# Patient Record
Sex: Male | Born: 1985 | Race: White | Hispanic: No | Marital: Single | Smoking: Current every day smoker
Health system: Southern US, Community
[De-identification: ages and names within clinical notes are randomized; demographics above are authoritative.]

## PROBLEM LIST (undated history)

## (undated) HISTORY — PX: HX WISDOM TEETH EXTRACTION: SHX21

---
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
## 2008-11-11 ENCOUNTER — Emergency Department
Admit: 2008-11-11 | Discharge: 2008-11-11 | Disposition: A | Discharge status: Home / Self Care | Payer: Self-pay | Attending: EXTERNAL | Admitting: EXTERNAL

## 2009-05-16 ENCOUNTER — Emergency Department
Admit: 2009-05-16 | Discharge: 2009-05-16 | Disposition: A | Discharge status: Home / Self Care | Payer: Self-pay | Attending: EXTERNAL | Admitting: EXTERNAL

## 2009-08-19 ENCOUNTER — Emergency Department: Admit: 2009-08-19 | Disposition: A | Discharge status: Home / Self Care | Payer: Self-pay

## 2010-04-26 ENCOUNTER — Emergency Department: Admit: 2010-04-26 | Disposition: A | Discharge status: Home / Self Care | Payer: Self-pay

## 2010-12-09 ENCOUNTER — Inpatient Hospital Stay (HOSPITAL_COMMUNITY)
Admission: EM | Admit: 2010-12-09 | Discharge: 2010-12-13 | DRG: 603 | Disposition: A | Discharge status: Home / Self Care | Payer: 59 | Attending: Infectious Diseases | Admitting: Infectious Diseases

## 2010-12-09 DIAGNOSIS — L0390 Cellulitis, unspecified: Secondary | ICD-10-CM

## 2010-12-09 DIAGNOSIS — F17200 Nicotine dependence, unspecified, uncomplicated: Secondary | ICD-10-CM | POA: Diagnosis present

## 2010-12-09 DIAGNOSIS — L02429 Furuncle of limb, unspecified: Principal | ICD-10-CM | POA: Diagnosis present

## 2010-12-09 DIAGNOSIS — IMO0002 Reserved for concepts with insufficient information to code with codable children: Secondary | ICD-10-CM | POA: Diagnosis present

## 2010-12-09 LAB — BASIC METABOLIC PANEL
BUN: 7 mg/dL (ref 6–23)
CO2: 26 mEq/L (ref 19–32)
Chloride: 96 mEq/L (ref 96–112)
Creatinine, Ser: 0.96 mg/dL (ref 0.50–1.35)
Glucose, Bld: 96 mg/dL (ref 70–99)

## 2010-12-09 LAB — CBC
HCT: 43.2 % (ref 39.0–52.0)
MCV: 90.2 fL (ref 78.0–100.0)
RBC: 4.79 MIL/uL (ref 4.22–5.81)
WBC: 15.2 10*3/uL — ABNORMAL HIGH (ref 4.0–10.5)

## 2010-12-09 LAB — DIFFERENTIAL
Eosinophils Relative: 0 % (ref 0–5)
Lymphocytes Relative: 14 % (ref 12–46)
Lymphs Abs: 2.1 10*3/uL (ref 0.7–4.0)
Neutrophils Relative %: 76 % (ref 43–77)

## 2010-12-10 ENCOUNTER — Encounter: Payer: Self-pay | Admitting: Internal Medicine

## 2010-12-10 DIAGNOSIS — L0291 Cutaneous abscess, unspecified: Secondary | ICD-10-CM

## 2010-12-10 DIAGNOSIS — L0390 Cellulitis, unspecified: Secondary | ICD-10-CM | POA: Insufficient documentation

## 2010-12-10 DIAGNOSIS — L02423 Furuncle of right upper limb: Secondary | ICD-10-CM | POA: Insufficient documentation

## 2010-12-10 LAB — HIV ANTIBODY (ROUTINE TESTING W REFLEX): HIV: NONREACTIVE

## 2010-12-10 LAB — RAPID URINE DRUG SCREEN, HOSP PERFORMED
Amphetamines: NOT DETECTED
Benzodiazepines: NOT DETECTED
Opiates: NOT DETECTED

## 2010-12-10 LAB — HEPATITIS PANEL, ACUTE: Hep B C IgM: NEGATIVE

## 2010-12-10 MED ORDER — SODIUM CHLORIDE 0.9 % IJ SOLN
3.0000 mL | Freq: Two times a day (BID) | INTRAMUSCULAR | Status: DC
  Administered 2010-12-11 – 2010-12-12 (×5): 3 mL via INTRAVENOUS

## 2010-12-10 MED ORDER — TRAMADOL HCL 50 MG PO TABS
50.0000 mg | ORAL_TABLET | Freq: Four times a day (QID) | ORAL | Status: DC | PRN
  Filled 2010-12-10: qty 1

## 2010-12-10 MED ORDER — VANCOMYCIN HCL 1000 MG IV SOLR
1500.0000 mg | Freq: Two times a day (BID) | INTRAVENOUS | Status: DC
  Administered 2010-12-11 – 2010-12-12 (×3): 1500 mg via INTRAVENOUS
  Filled 2010-12-10 (×5): qty 1500

## 2010-12-10 MED ORDER — HEPARIN SODIUM (PORCINE) 5000 UNIT/ML IJ SOLN
5000.0000 [IU] | Freq: Three times a day (TID) | INTRAMUSCULAR | Status: DC
  Administered 2010-12-11 – 2010-12-13 (×7): 5000 [IU] via SUBCUTANEOUS
  Filled 2010-12-10 (×11): qty 1

## 2010-12-10 MED ORDER — ACETAMINOPHEN 325 MG PO TABS: 650.0000 mg | ORAL_TABLET | Freq: Four times a day (QID) | ORAL | Status: DC | PRN

## 2010-12-10 MED ORDER — MORPHINE SULFATE 2 MG/ML IJ SOLN: 1.0000 mg | INTRAMUSCULAR | Status: DC | PRN

## 2010-12-10 NOTE — H&P (Signed)
Hospital Admission Note Date: 12/10/2010  Patient name: Mark Glover Medical record number: [id number] Date of birth: August 30, 1985 Age: 25 y.o. Gender: male PCP: No primary provider on file.  Medical Service:  Attending physician: Dr. Ninetta Lights    1st Contact: Tawni Carnes, MS4  Pager: [phone] 2nd Contact: Dr. Almyra Deforest  Pager: [phone]  After 5 pm or weekends: 1st Contact:      Pager: [phone] 2nd Contact:      Pager: [phone]  Chief Complaint: cellulitis  History of Present Illness: Mr. Mark Glover is a 25 year old male who presents with red skin, induration and malaise that started two weeks ago. The patient states that two weeks ago he noticed a small bump on his right biceps. The patient noted some increased redness until yesterday when he was working out. He felt his biceps and noticed a mass in his arm that was extremely painful to palpation. The patient has since noticed some subjective fever and headache. He denies any runny nose, sore throat, cough, N/V/D, or dysuria. Of note, in July the patient had a skin infection that he described as a large pimple that was treated with cephalexin. He states that he finished the entire prescribed doses of his antibiotics.  Due to his progressing symptoms, the patient presented to the ED today. In the ED the patient had an I&D and was started on clindamycin. He also received naproxen for pain.  Meds: None (other than keflex in June 2012)  Allergies: NKDA  Past Medical History: No PMH  Past Surgical History: None reported  Family History: Lung cancer in grandfather  Social History: [place]. He recently moved from [place]. He currently works at [employer] and might be moving to [facility] for the job soon. The patient smokes a pack every 2-3 days and has been smoking for several years. He states that he only uses alcohol occasionally. He denies any recent illicit drug use, although states that he used  to when he was younger.  Review of Systems: ROS negative except otherwise noted in the HPI  Physical Exam: Vitals: T 100.4, HR 103, BP 122/79, RR 18, O2 sat 100% on ra General: well-appearing, sitting up in bed Eyes: EOMI, PERRL, poor dentition Neck: non-tender, no LAD Chest: CTAB w/out w/r/c CV: RRR, nrml S1S2, no m/r/g Abd: soft, NT, ND, NABS Ext: erythema from medial bicep/axilla to elbow, wound in proximal biceps is packed, arm is TTP, no LE edema  Lab results: Basic Metabolic Panel:  [facility] 12/09/10 2236  NA 134*  K 3.8  CL 96  CO2 26  GLUCOSE 96  BUN 7  CREATININE 0.96  CALCIUM 9.7  MG --  PHOS --   CBC:  Basename 12/09/10 2236  WBC 15.2*  NEUTROABS 11.5*  HGB 15.7  HCT 43.2  MCV 90.2  PLT 174   Urine Drug Screen: Pending  Misc. Labs: Blood culture pending HIV ab pending RPR pending   Assessment & Plan by Problem: Mr. Mark Glover is a 25 year old male who presents with red skin, induration and malaise that started two weeks ago.  1. Furuncle: Given the description of a pinpoint infection that recently spread into a fluctuant mass, this most likely represents a furuncle, with possible development into a skin abscess. The most common pathogen causing this infection is S. aureus, including species of MRSA. Given his lack of risk factors such as immunodeficiency or diabetes, this infection is less likely caused by Pseduomonas or Candida. Given  the patient's WBC and fever, there is concern for bacteremia. The patient is hemodynamically stable and does not meet SIRS criteria.  The first line treatment for these infections involves I&D. This patient should also be treated with IV abx given the extensive surrounding cellulitis and concern for systemic involvement. Given the concern for MRSA and the presence of risk factors (e.g. working out in a gym), the patient will be started on IV vancomycin and admitted for observation. - D/c clindamycin - Start IV vancomycin.  Narrow abx based on cx results. - Give CBG path - Obtain blood cx, abscess cx, HCV test, HIV test, RPR and UDS - Place on contact precautions for MRSA - Provide information on reducing the likelihood of recurrence and spread  2. Tobacco abuse:  - Have social work provide smoking cessation information - Give flu vaccine and pneumovax due to risk factors  3. Dispo: The patient will be discharged following improvement in his condition and adequate inpatient treatment.   R2______________________________      ATTENDING: I performed and/or observed a history and physical examination of the patient.  I discussed the case with the residents as noted and reviewed the residents' notes.  I agree with the findings and plan--please refer to the attending physician note for more details.  Signature________________________________  Printed Name_____________________________

## 2010-12-11 DIAGNOSIS — L0390 Cellulitis, unspecified: Secondary | ICD-10-CM

## 2010-12-11 LAB — CBC
MCH: 31.1 pg (ref 26.0–34.0)
MCV: 91.7 fL (ref 78.0–100.0)
Platelets: 150 10*3/uL (ref 150–400)
RDW: 11.9 % (ref 11.5–15.5)

## 2010-12-11 LAB — BASIC METABOLIC PANEL
BUN: 6 mg/dL (ref 6–23)
CO2: 25 mEq/L (ref 19–32)
Calcium: 9.4 mg/dL (ref 8.4–10.5)
Creatinine, Ser: 0.76 mg/dL (ref 0.50–1.35)
GFR calc Af Amer: >90 mL/min (ref >90)

## 2010-12-11 MED ORDER — MORPHINE SULFATE 4 MG/ML IJ SOLN
INTRAMUSCULAR | Status: AC
  Administered 2010-12-11: 1 mg
  Filled 2010-12-11: qty 1

## 2010-12-11 MED ORDER — MORPHINE SULFATE 4 MG/ML IJ SOLN
INTRAMUSCULAR | Status: AC
  Administered 2010-12-11: 1 mg via INTRAVENOUS
  Filled 2010-12-11: qty 1

## 2010-12-11 NOTE — Progress Notes (Signed)
Subjective: Patient states that he's continuing to have pain around his wound site. Otherwise, he has no complaints.  Objective: Vital signs in last 24 hours: Filed Vitals:   12/10/10 1900 12/11/10 0700  BP:  107/60  Pulse:  82  Temp:  97.5 F (36.4 C)  TempSrc:  Oral  Resp:  18  Height: 5\' 7"  (1.702 m)   Weight: 180 lb (81.647 kg)   SpO2:  97%   Weight change:  No intake or output data in the 24 hours ending 12/11/10 1043 BP 107/60  Pulse 82  Temp(Src) 97.5 F (36.4 C) (Oral)  Resp 18  Ht 5\' 7"  (1.702 m)  Wt 180 lb (81.647 kg)  BMI 28.19 kg/m2  SpO2 97%  General Appearance:    Alert, cooperative, no distress, appears stated age              Throat:   Lips, mucosa, and tongue normal; teeth and gums normal  Neck:   Supple,      Lungs:     Clear to auscultation bilaterally, respirations unlabored     Heart:    Regular rate and rhythm, S1 and S2 normal, no murmur, rub   or gallop  Abdomen:     Soft, non-tender, bowel sounds active all four quadrants,    no masses, no organomegaly        Extremities: erythema from medial bicep/axilla to elbow, wound in proximal biceps is packed, arm is TTP, no LE edema   Pulses:   2+ and symmetric all extremities        Neurologic:   Normal strength, sensation and reflexes      throughout   Lab Results: Basic Metabolic Panel:  Lab 12/11/10 1914 12/09/10 2236  NA 135 134*  K 3.9 3.8  CL 101 96  CO2 25 26  GLUCOSE 95 96  BUN 6 7  CREATININE 0.76 0.96  CALCIUM 9.4 9.7  MG -- --  PHOS -- --  CBC:  Lab 12/11/10 0700 12/09/10 2236  WBC 6.7 15.2*  NEUTROABS -- 11.5*  HGB 14.3 15.7  HCT 42.2 43.2  MCV 91.7 90.2  PLT 150 174  Medications: I have reviewed the patient's current medications. Scheduled Meds:   . morphine      . heparin subcutaneous  5,000 Units Subcutaneous Q8H  . morphine      . sodium chloride  3 mL Intravenous Q12H  . vancomycin  1,500 mg Intravenous Q12H   Continuous Infusions:  PRN  Meds:.acetaminophen, morphine injection, traMADol Assessment/Plan:  Mark Glover is a 25 y/o M who presents with area of redness on R biceps w/ induration and malaise, which started 2 weeks ago.  1. Furuncle with surrounding cellulitis: Patient started on treatment with vancomycin for suspected MRSA furuncle. Blood cx were gathered yesterday. Unfortunately, I&D cultures are unavailable. - Continue on vancomycin 1.5 g qday - F/u cultures and narrow abx as appropriate - Continue to manage pain with morphine 1 mg q4 prn. Likely transition to PO pain medications tomorrow.  2. Dispo - Following final cx can likely d/c pt with oral antibiotics (e.g. bactrim, doxycycline, clindamycin) and oral pain medication.    LOS: 2 days   Mark Glover 12/11/2010, 10:43 AM

## 2010-12-11 NOTE — Plan of Care (Signed)
Problem: Phase II Progression Outcomes Goal: Wound without signs/symptoms of infection, decreasing edema Outcome: Progressing Less erythema

## 2010-12-12 LAB — CBC
Hemoglobin: 14.2 g/dL (ref 13.0–17.0)
MCHC: 32.3 g/dL (ref 30.0–36.0)
Platelets: 184 10*3/uL (ref 150–400)
RDW: 11.8 % (ref 11.5–15.5)

## 2010-12-12 LAB — BASIC METABOLIC PANEL
GFR calc Af Amer: >90 mL/min (ref >90)
GFR calc non Af Amer: >90 mL/min (ref >90)
Potassium: 4.8 mEq/L (ref 3.5–5.1)
Sodium: 138 mEq/L (ref 135–145)

## 2010-12-12 MED ORDER — MORPHINE SULFATE 4 MG/ML IJ SOLN
INTRAMUSCULAR | Status: AC
  Administered 2010-12-12: 1 mg via INTRAVENOUS
  Filled 2010-12-12: qty 1

## 2010-12-12 MED ORDER — DOXYCYCLINE HYCLATE 100 MG PO TABS
100.0000 mg | ORAL_TABLET | Freq: Two times a day (BID) | ORAL | Status: DC
  Administered 2010-12-12 – 2010-12-13 (×3): 100 mg via ORAL
  Filled 2010-12-12 (×4): qty 1

## 2010-12-12 MED ORDER — NICOTINE 21 MG/24HR TD PT24
21.0000 mg | MEDICATED_PATCH | Freq: Every day | TRANSDERMAL | Status: DC
  Administered 2010-12-12: 21 mg via TRANSDERMAL
  Filled 2010-12-12 (×2): qty 1

## 2010-12-12 MED ORDER — MORPHINE SULFATE 4 MG/ML IJ SOLN
INTRAMUSCULAR | Status: AC
  Administered 2010-12-12: 1 mg
  Filled 2010-12-12: qty 1

## 2010-12-12 MED ORDER — HYDROCODONE-ACETAMINOPHEN 5-325 MG PO TABS
1.0000 | ORAL_TABLET | Freq: Four times a day (QID) | ORAL | Status: DC | PRN
  Administered 2010-12-12 – 2010-12-13 (×3): 1 via ORAL
  Filled 2010-12-12: qty 1
  Filled 2010-12-12: qty 2
  Filled 2010-12-12 (×2): qty 1

## 2010-12-12 NOTE — Consult Note (Signed)
Pt with I and D performed in ER on Friday and started on abx.  Right arm with generalized edema and erythemia, hard to palpation in area 8X3cm with full thickness opening .3X.3X1 cm.  Unable to visualize inner wound bed, appears dk red with mod red drainage, no odor.  Difficult to pack opening since it appears to be full of old dried blood.  Remains painful to touch.   Plan:  Physical therapy to perform hydrotherapy to assist with removal of nonviable tissue.  Pack with iodoform to assist with drainage.  If no improvement after these treatments, then patient may need a surgical consult to enlarge opening to facilitate drainage and enable packing application.   Cammie Mcgee, MSN, [employer]

## 2010-12-12 NOTE — Progress Notes (Signed)
Hydrotherapy evaluation completed and placed in paper chart due to incorrect computer access.  Will follow for hydrotherapy M - Sat.

## 2010-12-12 NOTE — Progress Notes (Signed)
Subjective:  Patient reports that he is doing fine but would like to have a nicotine patch. The pain in the axillar is still present but noted that the redness on his arm has improved. She denies any chest pain, SOB, abdominal pain, urinary symptoms.  Objective: Vital signs in last 24 hours: Filed Vitals:   12/11/10 0700 12/11/10 1400 12/11/10 2100 12/12/10 0500  BP: 107/60 115/60 113/57 109/76  Pulse: 82 62 52 54  Temp: 97.5 F (36.4 C) 98.8 F (37.1 C) 99 F (37.2 C) 98.1 F (36.7 C)  TempSrc: Oral Oral Oral Oral  Resp: 18 18 18 18   Height:      Weight:      SpO2: 97% 99% 98% 99%   Weight change:   Intake/Output Summary (Last 24 hours) at 12/12/10 1311 Last data filed at 12/12/10 0945  Gross per 24 hour  Intake   1800 ml  Output      0 ml  Net   1800 ml   BP 109/76  Pulse 54  Temp(Src) 98.1 F (36.7 C) (Oral)  Resp 18  Ht 5\' 7"  (1.702 m)  Wt 180 lb (81.647 kg)  BMI 28.19 kg/m2  SpO2 99%  General Appearance:    Alert, cooperative, no distress, appears stated age                       Lungs:     Clear to auscultation bilaterally, respirations unlabored  Chest wall:    No tenderness or deformity  Heart:    Regular rate and rhythm, S1 and S2 normal, no murmur, rub   or gallop  Abdomen:     Soft, non-tender, bowel sounds active all four quadrants,    no masses, no organomegaly        Extremities:   Extremities normal, atraumatic, no cyanosis or edema  Pulses:   2+ and symmetric all extremities  Skin:     decreased redness surrounding wound, open wound on proximal biceps with packing in it, no pustular drainage, extremely tender to palpation     Neurologic:   CNII-XII intact. Normal strength, sensation and reflexes      throughout   Lab Results: Basic Metabolic Panel:  Lab 12/12/10 1191 12/11/10 0700  NA 138 135  K 4.8 3.9  CL 102 101  CO2 26 25  GLUCOSE 94 95  BUN 9 6  CREATININE 0.76 0.76  CALCIUM 10.0 9.4  MG -- --  PHOS -- --   CBC:  Lab  12/12/10 0750 12/11/10 0700 12/09/10 2236  WBC 6.2 6.7 --  NEUTROABS -- -- 11.5*  HGB 14.2 14.3 --  HCT 43.9 42.2 --  MCV 91.8 91.7 --  PLT 184 150 --   Micro Results: Recent Results (from the past 240 hour(s))  CULTURE, BLOOD (ROUTINE X 2)     Status: Normal (Preliminary result)   Collection Time   12/10/10 11:35 AM      Component Value Range Status Comment   Specimen Description BLOOD LEFT ARM   Final    Special Requests BOTTLES DRAWN AEROBIC AND ANAEROBIC 10CC   Final    Setup Time [id number]   Final    Culture     Final    Value:        BLOOD CULTURE RECEIVED NO GROWTH TO DATE CULTURE WILL BE HELD FOR 5 DAYS BEFORE ISSUING A FINAL NEGATIVE REPORT   Report Status PENDING   Incomplete   CULTURE, BLOOD (  ROUTINE X 2)     Status: Normal (Preliminary result)   Collection Time   12/10/10 11:40 AM      Component Value Range Status Comment   Specimen Description BLOOD RIGHT HAND   Final    Special Requests BOTTLES DRAWN AEROBIC AND ANAEROBIC 10CC   Final    Setup Time [id number]   Final    Culture     Final    Value:        BLOOD CULTURE RECEIVED NO GROWTH TO DATE CULTURE WILL BE HELD FOR 5 DAYS BEFORE ISSUING A FINAL NEGATIVE REPORT   Report Status PENDING   Incomplete    Studies/Results: No results found. Medications: I have reviewed the patient's current medications. Scheduled Meds:   . morphine      . doxycycline  100 mg Oral Q12H  . heparin subcutaneous  5,000 Units Subcutaneous Q8H  . morphine      . morphine      . morphine      . morphine      . nicotine  21 mg Transdermal Daily  . sodium chloride  3 mL Intravenous Q12H  . DISCONTD: vancomycin  1,500 mg Intravenous Q12H   Continuous Infusions:  PRN Meds:.acetaminophen, morphine injection, traMADol Assessment/Plan:  1. Furuncle w/ surrounding cellulitis: Patient being treated with vancomycin for suspected MRSA furuncle. Blood cx were gathered yesterday. Unfortunately, I&D cultures are unavailable. - D/C  vancomycin and start Doxycyline today - F/u cultures and narrow abx as appropriate - Continue to manage pain with Morphine - Wound care consulted   2. Pain control: Patient is receiving morphine 1 mg q4h, Tylenol and tramadol. We can likely transition to PO medications (ex. Vicodin 1 mg q6h) to determine his PO med requirements before pt is discharged.  3. Tobacco abuse: Placed nicotine patch today. SW consult for Smoking Cessation in place  4. Dispo - Following final cx can likely d/c pt with oral antibiotics (e.g. bactrim, doxycycline, clindamycin) Tawni Carnes, MS4   LOS: 3 days   Amberlea Spagnuolo 12/12/2010, 1:11 PM

## 2010-12-12 NOTE — Progress Notes (Signed)
Pre med

## 2010-12-12 NOTE — Progress Notes (Signed)
  Internal Medicine Teaching Service Attending Note Date: 12/12/2010  Patient name: Mark Glover  Medical record number: [id number]  Date of birth: December 09, 1985    This patient has been seen and discussed with the house staff. Please see their note for complete details. I concur with their findings with the following additions/corrections: Will get pt nicotine replacement as he requests. He had no wound/abscess cx done in ED with his i & D. Will contniue his vanco for now, his erythema is dramatically better. Plan to transition him to po bactrim or doxy soon.   Johny Sax 12/12/2010, 10:19 AM

## 2010-12-13 MED ORDER — HYDROCODONE-ACETAMINOPHEN 5-325 MG PO TABS: 1.0000 | ORAL_TABLET | ORAL | Status: AC | PRN

## 2010-12-13 MED ORDER — DOXYCYCLINE HYCLATE 100 MG PO TABS: 100.0000 mg | ORAL_TABLET | Freq: Two times a day (BID) | ORAL | Status: AC

## 2010-12-13 NOTE — Progress Notes (Signed)
D/C home instructions reviewed and given to pt. Pt discharged home. Assessment unchanged from morning assessment.

## 2010-12-13 NOTE — Discharge Summary (Signed)
Physician Discharge Summary  Patient ID: Mark Glover MRN: [id number] DOB/AGE: February 26, 1985 25 y.o.  Admit date: 12/09/2010 Discharge date: 12/13/2010   Discharge Diagnoses:  1. Furnuncle with cellulitis, right arm 2. Tobacco use  Discharged Condition: good  Brief Admitting H&P: Mr. Mark Glover is a 25 year old male who presents with red skin, induration and malaise that started two weeks prior to admission. The patient states he first noticed a small bump on his right biceps approximately 2 weeks PTA. The patient noted some increased redness until yesterday when he was working out. He felt his biceps and noticed a mass in his arm that was extremely painful to palpation. The patient has since noticed some subjective fever and headache. He denies any runny nose, sore throat, cough, N/V/D, or dysuria. Of note, in July the patient had a skin infection that he described as a large pimple that was treated with cephalexin. He states that he finished the entire prescribed doses of his antibiotics.  Due to his progressing symptoms, the patient presented to the ED today. In the ED the patient had an incision and drainage and was started on clindamycin. He also received naproxen for pain.  Admitting Exam: Vital signs: T 100.4, HR 103, BP 122/79, RR 18, O2 sat 100% on ra :  Vitals: T 100.4, HR 103, BP 122/79, RR 18, O2 sat 100% on ra  General: well-appearing, sitting up in bed  Eyes: EOMI, PERRL, poor dentition  Neck: non-tender, no LAD  Chest: CTAB w/out w/r/c  CV: RRR, nrml S1S2, no m/r/g  Abd: soft, NT, ND, NABS  Ext: erythema from medial bicep/axilla to elbow, wound in proximal biceps is packed, arm is TTP, no LE edema   Hospital Course:  1. Furuncle with cellulitis: The patient was admitted with a furuncle with cellulitis, suspicious for MRSA. The patient had I&D performed in the ED, the wound was packed with iodoform dressing and he was initially started on clindamycin. Given the concern for  extensive surrounding cellulitis and systemic infection, we started the patient on IV vancomycin and admitted the patient to the floor. In addition, we drew a blood culture that showed no growth during the hospitalization. The wound's surrounding erythema decreased during hospitalization and the patient remained afebrile. His wound appeared healthy throughout the hospitalization but was extremely tender to palpation. Wound therapy was consulted on 11/5 and noted some surrounding nonviable tissue that was removed by hydrotherapy. The patient complained of pain during the hospitalization, which was fairly well controlled with morphine prn, Tylenol prn and tramadol prn. His was transitioned to PO norco on 11/5.   2. Tobacco abuse: Patient given tobacco counseling and we discussed options for tobacco cessation. He was given a nicotine patch on 11/5.  Consults: none  Significant Diagnostic Studies:   1. Wound I&D (12/09/10) 2. Wound hydrotherapy (12/12/10)  Discharge Vitals: Blood pressure 103/60, pulse 59, temperature 97.8 F (36.6 C), temperature source Oral, resp. rate 19, height 5\' 7"  (1.702 m), weight 180 lb (81.647 kg), SpO2 95.00%.   Disposition:  The patient will follow up with Dr. Maisie Fus in [facility] on 12/21/10 at 9:15 AM. During this visit the physician should assess the wound for any erythema, swelling, warmth or drainage. The patient should be asked about pain control and any fever, chills, N/V, diarrhea or other signs of infection.  Discharge Orders    Future Appointments: Provider: Department: Dept Phone: Center:   12/21/2010 9:15 AM Lars [facility] [phone]  [facility]     Current Discharge Medication List    START taking these medications   Details  doxycycline (VIBRA-TABS) 100 MG tablet Take 1 tablet (100 mg total) by mouth every 12 (twelve) hours. Qty: 20 tablet, Refills: 0    HYDROcodone-acetaminophen (NORCO) 5-325 MG per tablet Take 1  tablet by mouth every 4 (four) hours as needed for pain. Qty: 30 tablet, Refills: 0       Follow-up Information    Follow up with Margorie John, MD on 12/21/2010. (9:15 AM)    Contact information:   [address] [place] [address] [phone]          Signed: Nelda Bucks 12/13/2010, 8:27 AM  Pt seen, discussed with h/o's. He is ready for d/c. Will discuss with team regarding possible need for home health and dressing changes. Pt says he has brother who is EMT and this may be a resource as well.

## 2010-12-13 NOTE — Progress Notes (Signed)
Physical Therapy Wound Treatment Patient Details  Name: Mark Glover MRN: [id number] Date of Birth: [birth year]  Today's Date: 12/13/2010 [phone]    Subjective     Pain Score: Pain Score:   4  Wound Assessment  Incision Arm Right (Active)  Site / Wound Assessment Clean;Dry 12/13/2010  9:32 AM  Closure None 12/11/2010  9:00 AM  Drainage Amount Minimal 12/13/2010 11:54 AM  Drainage Description Serous 12/13/2010 11:54 AM  Dressing Type Moist to dry 12/13/2010 11:54 AM  Dressing Changed 12/13/2010 11:54 AM       Wound Treatment   Pt. Received Pulsatile Lavage at 4 psi X NS to rt. Axillary wound with diverter tip with shield.  Wound Therapy Goals- Improve the function of patient's integumentary system by progressing the wound(s) through the phases of wound healing (inflammation - proliferation - remodeling) by: Decrease Necrotic Tissue to: 0% Decrease Necrotic Tissue - Progress: Met Increase Granulation Tissue to: 100% Increase Granulation Tissue - Progress: Met Patient/Family will be able to : Verbally acknowledge dressing change and positioning. Patient/Family Instruction Goal - Progress: Met  Goals will be updated until maximal potential achieved or discharge criteria met.  Discharge criteria: when goals achieved, discharge from hospital, MD decision/surgical intervention, no progress towards goals, refusal/missing three consecutive treatments without notification or medical reason.  Vernette Moise 12/13/2010, 12:00 PM

## 2010-12-16 LAB — CULTURE, BLOOD (ROUTINE X 2): Culture  Setup Time: 201211031746

## 2010-12-21 ENCOUNTER — Encounter: Payer: 59 | Admitting: Internal Medicine

## 2011-06-07 ENCOUNTER — Emergency Department
Admission: EM | Admit: 2011-06-07 | Discharge: 2011-06-07 | Disposition: A | Discharge status: Home / Self Care | Payer: Self-pay | Attending: Physician Assistant | Admitting: Physician Assistant

## 2011-06-07 DIAGNOSIS — R51 Headache: Secondary | ICD-10-CM | POA: Insufficient documentation

## 2011-06-07 DIAGNOSIS — K047 Periapical abscess without sinus: Secondary | ICD-10-CM | POA: Insufficient documentation

## 2011-06-07 DIAGNOSIS — K089 Disorder of teeth and supporting structures, unspecified: Secondary | ICD-10-CM | POA: Insufficient documentation

## 2011-06-07 MED ORDER — HYDROCODONE 5 MG-ACETAMINOPHEN 500 MG TABLET
1.00 | ORAL_TABLET | ORAL | Status: DC | PRN
Start: 2011-06-07 — End: 2016-08-27

## 2011-06-07 MED ORDER — PENICILLIN V POTASSIUM 500 MG TABLET
ORAL_TABLET | ORAL | Status: AC
Start: 2011-06-07 — End: 2011-06-07
  Administered 2011-06-07: 500 mg via ORAL
  Filled 2011-06-07: qty 1

## 2011-06-07 MED ORDER — IBUPROFEN 800 MG TABLET
800.00 mg | ORAL_TABLET | ORAL | Status: AC
Start: 2011-06-07 — End: 2011-06-07

## 2011-06-07 MED ORDER — IBUPROFEN 800 MG TABLET
ORAL_TABLET | ORAL | Status: AC
Start: 2011-06-07 — End: 2011-06-07
  Administered 2011-06-07: 800 mg via ORAL
  Filled 2011-06-07: qty 1

## 2011-06-07 MED ORDER — PENICILLIN V POTASSIUM 500 MG TABLET
500.00 mg | ORAL_TABLET | Freq: Three times a day (TID) | ORAL | Status: AC
Start: 2011-06-07 — End: 2011-06-17

## 2011-06-07 MED ORDER — HYDROCODONE 5 MG-ACETAMINOPHEN 325 MG TABLET
2.00 | ORAL_TABLET | ORAL | Status: AC
Start: 2011-06-07 — End: 2011-06-07

## 2011-06-07 MED ORDER — PENICILLIN V POTASSIUM 500 MG TABLET
500.00 mg | ORAL_TABLET | ORAL | Status: AC
Start: 2011-06-07 — End: 2011-06-07

## 2011-06-07 MED ORDER — IBUPROFEN 800 MG TABLET
800.00 mg | ORAL_TABLET | Freq: Three times a day (TID) | ORAL | Status: DC | PRN
Start: 2011-06-07 — End: 2017-08-03

## 2011-06-07 MED ORDER — HYDROCODONE 5 MG-ACETAMINOPHEN 325 MG TABLET
ORAL_TABLET | ORAL | Status: AC
Start: 2011-06-07 — End: 2011-06-07
  Administered 2011-06-07: 2 via ORAL
  Filled 2011-06-07: qty 2

## 2011-06-07 NOTE — ED Provider Notes (Signed)
HPI  Pt c/o R upper tooth pain x 2 days, facial swelling which started last pm.  Pain 6/10 sharp, worse with eating, no interventions.  No fevers, chills, sweats, N/V.  Pt does have a R HA.      Review of Systems   Constitutional: Positive for activity change. Negative for fever, chills, appetite change and fatigue.   HENT: Positive for dental problem. Negative for trouble swallowing.    Gastrointestinal: Negative for nausea and vomiting.   Neurological: Positive for headaches.     History: History reviewed. No pertinent past medical history.    Physical Exam   Nursing note and vitals reviewed.  Constitutional: He appears well-developed and well-nourished. He appears distressed.   HENT:   Head: Normocephalic and atraumatic.       Mouth/Throat: Oropharynx is clear and moist.            Mild R Facial swelling with very mild erythema.  Large cavity with surrounding gingival erythema and edema and periapical tenderness.     Neck: Normal range of motion. Neck supple.   Skin: Skin is warm and dry. There is erythema.     ED Course:    Results:    ED Course:    Evaluation / Plan:  Dx: dental abscess  Pen VK, IBU 800mg , lortab, reviewed meds, Patient voiced understanding of instructions.

## 2011-06-07 NOTE — ED Nurses Note (Signed)
Pt started with left upper dental pain and right lower dental pain yesterday. Pt now has swelling in right side of face. No acute distress noted.

## 2011-06-07 NOTE — ED Nurses Note (Signed)
Patient discharged home with family.  AVS reviewed with patient/care giver.  A written copy of the AVS and discharge instructions was given to the patient/care giver.  Questions sufficiently answered as needed.  Patient/care giver encouraged to follow up with PCP as indicated.  In the event of an emergency, patient/care giver instructed to call 911 or go to the nearest emergency room.

## 2015-04-17 ENCOUNTER — Emergency Department (HOSPITAL_COMMUNITY)
Admission: EM | Admit: 2015-04-17 | Discharge: 2015-04-17 | Disposition: A | Discharge status: Home / Self Care | Payer: Self-pay | Attending: Emergency Medicine | Admitting: Emergency Medicine

## 2015-04-17 ENCOUNTER — Encounter (HOSPITAL_COMMUNITY): Payer: Self-pay | Admitting: Emergency Medicine

## 2015-04-17 ENCOUNTER — Emergency Department (HOSPITAL_COMMUNITY): Payer: Self-pay

## 2015-04-17 DIAGNOSIS — Y92009 Unspecified place in unspecified non-institutional (private) residence as the place of occurrence of the external cause: Secondary | ICD-10-CM | POA: Insufficient documentation

## 2015-04-17 DIAGNOSIS — S060X9A Concussion with loss of consciousness of unspecified duration, initial encounter: Secondary | ICD-10-CM | POA: Insufficient documentation

## 2015-04-17 DIAGNOSIS — W1839XA Other fall on same level, initial encounter: Secondary | ICD-10-CM | POA: Insufficient documentation

## 2015-04-17 DIAGNOSIS — Y9389 Activity, other specified: Secondary | ICD-10-CM | POA: Insufficient documentation

## 2015-04-17 DIAGNOSIS — R55 Syncope and collapse: Secondary | ICD-10-CM | POA: Insufficient documentation

## 2015-04-17 DIAGNOSIS — R51 Headache: Secondary | ICD-10-CM | POA: Insufficient documentation

## 2015-04-17 DIAGNOSIS — Y999 Unspecified external cause status: Secondary | ICD-10-CM | POA: Insufficient documentation

## 2015-04-17 DIAGNOSIS — F17200 Nicotine dependence, unspecified, uncomplicated: Secondary | ICD-10-CM | POA: Insufficient documentation

## 2015-04-17 DIAGNOSIS — S0181XA Laceration without foreign body of other part of head, initial encounter: Secondary | ICD-10-CM | POA: Insufficient documentation

## 2015-04-17 MED ORDER — TETANUS-DIPHTH-ACELL PERTUSSIS 5-2.5-18.5 LF-MCG/0.5 IM SUSP
0.5000 mL | Freq: Once | INTRAMUSCULAR | Status: AC
  Administered 2015-04-17: 0.5 mL via INTRAMUSCULAR
  Filled 2015-04-17: qty 0.5

## 2015-04-17 NOTE — ED Provider Notes (Signed)
CSN: [id number]     Arrival date & time 04/17/15  1425 History   First MD Initiated Contact with Patient 04/17/15 1538     Chief Complaint  Patient presents with  . Head Injury  . Loss of Consciousness      Patient is a 30 y.o. male presenting with head injury and syncope. The history is provided by the patient.  Head Injury Associated symptoms: headache   Associated symptoms: no nausea, no numbness and no vomiting   Loss of Consciousness Associated symptoms: confusion and headaches   Associated symptoms: no chest pain, no nausea, no shortness of breath, no vomiting and no weakness    patient states that around 8:30 this morning he fell when he tripped over his cat. Reportedly hit his head and was knocked out. Reportedly was knocked out for about an hour and a half before 28-year-old niece called the police. States he woke up with the police in his house and they were worried because of his drug paraphernalia around. 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 States he did not know was active. Patient states since then he has had confusion. He has no headache. Does have a laceration to the front of his forehead. Last tetanus was around 10 years ago.  History reviewed. No pertinent past medical history. History reviewed. No pertinent past surgical history. History reviewed. No pertinent family history. Social History  Substance Use Topics  . Smoking status: Current Every Day Smoker  . Smokeless tobacco: None  . Alcohol Use: Yes     Comment: occasionally    Review of Systems  Constitutional: Negative for activity change and appetite change.  Eyes: Negative for pain.  Respiratory: Negative for chest tightness and shortness of breath.   Cardiovascular: Positive for syncope. Negative for chest pain and leg swelling.  Gastrointestinal: Negative for nausea, vomiting, abdominal pain and diarrhea.  Genitourinary: Negative for  flank pain.  Musculoskeletal: Negative for back pain and neck stiffness.  Skin: Negative for rash.  Neurological: Positive for headaches. Negative for weakness and numbness.  Psychiatric/Behavioral: Positive for confusion. Negative for behavioral problems.      Allergies  Poison ivy extract  Home Medications   Prior to Admission medications   Not on File   BP 134/91 mmHg  Pulse 87  Temp(Src) 98.3 F (36.8 C) (Oral)  Resp 14  Ht 5\' 7"  (1.702 m)  Wt 180 lb (81.647 kg)  BMI 28.19 kg/m2  SpO2 100% Physical Exam  Constitutional: He appears well-developed.  HENT:  1 cm vertical laceration on anterior forehead. Mild swelling but no real deformity.  Cardiovascular: Normal rate.   Pulmonary/Chest: Effort normal.  Abdominal: Soft.  Musculoskeletal: Normal range of motion.  Neurological:  Patient is awake and appropriate but mildly slow to answer.  Skin: Skin is warm.    ED Course  Procedures (including critical care time) Labs Review Labs Reviewed - No data to display  Imaging Review Ct Head Wo Contrast  04/17/2015  CLINICAL DATA:  Tripped over CT and fell striking forehead on ground EXAM: CT HEAD WITHOUT CONTRAST TECHNIQUE: Contiguous axial images were obtained from the base of the skull through the vertex without intravenous contrast. COMPARISON:  None. FINDINGS: There is no evidence for acute hemorrhage, hydrocephalus, mass lesion, or abnormal extra-axial fluid collection. No definite CT evidence for acute infarction. The visualized paranasal sinuses and mastoid air cells are clear. No evidence for skull  fracture. IMPRESSION: Normal exam. Electronically Signed   By: Kennith Center M.D.   On: 04/17/2015 16:22   I have personally reviewed and evaluated these images and lab results as part of my medical decision-making.   EKG Interpretation None      MDM   Final diagnoses:  Concussion, with loss of consciousness of unspecified duration, initial encounter  Forehead  laceration, initial encounter    Patient with fall and concussion. Negative head CT. Laceration forehead does not appear to need suturing. Tetanus update patient will be discharged home.    Benjiman Core, MD 04/17/15 1705

## 2015-04-17 NOTE — Discharge Instructions (Signed)
Concussion, Adult °A concussion, or closed-head injury, is a brain injury caused by a direct blow to the head or by a quick and sudden movement (jolt) of the head or neck. Concussions are usually not life-threatening. Even so, the effects of a concussion can be serious. If you have had a concussion before, you are more likely to experience concussion-like symptoms after a direct blow to the head.  °CAUSES °· Direct blow to the head, such as from running into another player during a soccer game, being hit in a fight, or hitting your head on a hard surface. °· A jolt of the head or neck that causes the brain to move back and forth inside the skull, such as in a car crash. °SIGNS AND SYMPTOMS °The signs of a concussion can be hard to notice. Early on, they may be missed by you, family members, and health care providers. You may look fine but act or feel differently. °Symptoms are usually temporary, but they may last for days, weeks, or even longer. Some symptoms may appear right away while others may not show up for hours or days. Every head injury is different. Symptoms include: °· Mild to moderate headaches that will not go away. °· A feeling of pressure inside your head. °· Having more trouble than usual: °· Learning or remembering things you have heard. °· Answering questions. °· Paying attention or concentrating. °· Organizing daily tasks. °· Making decisions and solving problems. °· Slowness in thinking, acting or reacting, speaking, or reading. °· Getting lost or being easily confused. °· Feeling tired all the time or lacking energy (fatigued). °· Feeling drowsy. °· Sleep disturbances. °· Sleeping more than usual. °· Sleeping less than usual. °· Trouble falling asleep. °· Trouble sleeping (insomnia). °· Loss of balance or feeling lightheaded or dizzy. °· Nausea or vomiting. °· Numbness or tingling. °· Increased sensitivity to: °· Sounds. °· Lights. °· Distractions. °· Vision problems or eyes that tire  easily. °· Diminished sense of taste or smell. °· Ringing in the ears. °· Mood changes such as feeling sad or anxious. °· Becoming easily irritated or angry for little or no reason. °· Lack of motivation. °· Seeing or hearing things other people do not see or hear (hallucinations). °DIAGNOSIS °Your health care provider can usually diagnose a concussion based on a description of your injury and symptoms. He or she will ask whether you passed out (lost consciousness) and whether you are having trouble remembering events that happened right before and during your injury. °Your evaluation might include: °· A brain scan to look for signs of injury to the brain. Even if the test shows no injury, you may still have a concussion. °· Blood tests to be sure other problems are not present. °TREATMENT °· Concussions are usually treated in an emergency department, in urgent care, or at a clinic. You may need to stay in the hospital overnight for further treatment. °· Tell your health care provider if you are taking any medicines, including prescription medicines, over-the-counter medicines, and natural remedies. Some medicines, such as blood thinners (anticoagulants) and aspirin, may increase the chance of complications. Also tell your health care provider whether you have had alcohol or are taking illegal drugs. This information may affect treatment. °· Your health care provider will send you home with important instructions to follow. °· How fast you will recover from a concussion depends on many factors. These factors include how severe your concussion is, what part of your brain was injured,   your age, and how healthy you were before the concussion. °· Most people with mild injuries recover fully. Recovery can take time. In general, recovery is slower in older persons. Also, persons who have had a concussion in the past or have other medical problems may find that it takes longer to recover from their current injury. °HOME  CARE INSTRUCTIONS °General Instructions °· Carefully follow the directions your health care provider gave you. °· Only take over-the-counter or prescription medicines for pain, discomfort, or fever as directed by your health care provider. °· Take only those medicines that your health care provider has approved. °· Do not drink alcohol until your health care provider says you are well enough to do so. Alcohol and certain other drugs may slow your recovery and can put you at risk of further injury. °· If it is harder than usual to remember things, write them down. °· If you are easily distracted, try to do one thing at a time. For example, do not try to watch TV while fixing dinner. °· Talk with family members or close friends when making important decisions. °· Keep all follow-up appointments. Repeated evaluation of your symptoms is recommended for your recovery. °· Watch your symptoms and tell others to do the same. Complications sometimes occur after a concussion. Older adults with a brain injury may have a higher risk of serious complications, such as a blood clot on the brain. °· Tell your teachers, school nurse, school counselor, coach, athletic trainer, or work manager about your injury, symptoms, and restrictions. Tell them about what you can or cannot do. They should watch for: °· Increased problems with attention or concentration. °· Increased difficulty remembering or learning new information. °· Increased time needed to complete tasks or assignments. °· Increased irritability or decreased ability to cope with stress. °· Increased symptoms. °· Rest. Rest helps the brain to heal. Make sure you: °· Get plenty of sleep at night. Avoid staying up late at night. °· Keep the same bedtime hours on weekends and weekdays. °· Rest during the day. Take daytime naps or rest breaks when you feel tired. °· Limit activities that require a lot of thought or concentration. These include: °· Doing homework or job-related  work. °· Watching TV. °· Working on the computer. °· Avoid any situation where there is potential for another head injury (football, hockey, soccer, basketball, martial arts, downhill snow sports and horseback riding). Your condition will get worse every time you experience a concussion. You should avoid these activities until you are evaluated by the appropriate follow-up health care providers. °Returning To Your Regular Activities °You will need to return to your normal activities slowly, not all at once. You must give your body and brain enough time for recovery. °· Do not return to sports or other athletic activities until your health care provider tells you it is safe to do so. °· Ask your health care provider when you can drive, ride a bicycle, or operate heavy machinery. Your ability to react may be slower after a brain injury. Never do these activities if you are dizzy. °· Ask your health care provider about when you can return to work or school. °Preventing Another Concussion °It is very important to avoid another brain injury, especially before you have recovered. In rare cases, another injury can lead to permanent brain damage, brain swelling, or death. The risk of this is greatest during the first 7-10 days after a head injury. Avoid injuries by: °· Wearing a   seat belt when riding in a car. °· Drinking alcohol only in moderation. °· Wearing a helmet when biking, skiing, skateboarding, skating, or doing similar activities. °· Avoiding activities that could lead to a second concussion, such as contact or recreational sports, until your health care provider says it is okay. °· Taking safety measures in your home. °¨ Remove clutter and tripping hazards from floors and stairways. °¨ Use grab bars in bathrooms and handrails by stairs. °¨ Place non-slip mats on floors and in bathtubs. °¨ Improve lighting in dim areas. °SEEK MEDICAL CARE IF: °· You have increased problems paying attention or  concentrating. °· You have increased difficulty remembering or learning new information. °· You need more time to complete tasks or assignments than before. °· You have increased irritability or decreased ability to cope with stress. °· You have more symptoms than before. °Seek medical care if you have any of the following symptoms for more than 2 weeks after your injury: °· Lasting (chronic) headaches. °· Dizziness or balance problems. °· Nausea. °· Vision problems. °· Increased sensitivity to noise or light. °· Depression or mood swings. °· Anxiety or irritability. °· Memory problems. °· Difficulty concentrating or paying attention. °· Sleep problems. °· Feeling tired all the time. °SEEK IMMEDIATE MEDICAL CARE IF: °· You have severe or worsening headaches. These may be a sign of a blood clot in the brain. °· You have weakness (even if only in one hand, leg, or part of the face). °· You have numbness. °· You have decreased coordination. °· You vomit repeatedly. °· You have increased sleepiness. °· One pupil is larger than the other. °· You have convulsions. °· You have slurred speech. °· You have increased confusion. This may be a sign of a blood clot in the brain. °· You have increased restlessness, agitation, or irritability. °· You are unable to recognize people or places. °· You have neck pain. °· It is difficult to wake you up. °· You have unusual behavior changes. °· You lose consciousness. °MAKE SURE YOU: °· Understand these instructions. °· Will watch your condition. °· Will get help right away if you are not doing well or get worse. °  °This information is not intended to replace advice given to you by your health care provider. Make sure you discuss any questions you have with your health care provider. °  °Document Released: 04/15/2003 Document Revised: 02/13/2014 Document Reviewed: 08/15/2012 °Elsevier Interactive Patient Education ©2016 [employer] ° °Facial Laceration ° A facial laceration is a cut  on the face. These injuries can be painful and cause bleeding. Lacerations usually heal quickly, but they need special care to reduce scarring. °DIAGNOSIS  °Your health care provider will take a medical history, ask for details about how the injury occurred, and examine the wound to determine how deep the cut is. °TREATMENT  °Some facial lacerations may not require closure. Others may not be able to be closed because of an increased risk of infection. The risk of infection and the chance for successful closure will depend on various factors, including the amount of time since the injury occurred. °The wound may be cleaned to help prevent infection. If closure is appropriate, pain medicines may be given if needed. Your health care provider will use stitches (sutures), wound glue (adhesive), or skin adhesive strips to repair the laceration. These tools bring the skin edges together to allow for faster healing and a better cosmetic outcome. If needed, you may also be given a tetanus   shot. °HOME CARE INSTRUCTIONS °· Only take over-the-counter or prescription medicines as directed by your health care provider. °· Follow your health care provider's instructions for wound care. These instructions will vary depending on the technique used for closing the wound. °For Sutures: °· Keep the wound clean and dry.   °· If you were given a bandage (dressing), you should change it at least once a day. Also change the dressing if it becomes wet or dirty, or as directed by your health care provider.   °· Wash the wound with soap and water 2 times a day. Rinse the wound off with water to remove all soap. Pat the wound dry with a clean towel.   °· After cleaning, apply a thin layer of the antibiotic ointment recommended by your health care provider. This will help prevent infection and keep the dressing from sticking.   °· You may shower as usual after the first 24 hours. Do not soak the wound in water until the sutures are removed.    °· Get your sutures removed as directed by your health care provider. With facial lacerations, sutures should usually be taken out after 4-5 days to avoid stitch marks.   °· Wait a few days after your sutures are removed before applying any makeup. °For Skin Adhesive Strips: °· Keep the wound clean and dry.   °· Do not get the skin adhesive strips wet. You may bathe carefully, using caution to keep the wound dry.   °· If the wound gets wet, pat it dry with a clean towel.   °· Skin adhesive strips will fall off on their own. You may trim the strips as the wound heals. Do not remove skin adhesive strips that are still stuck to the wound. They will fall off in time.   °For Wound Adhesive: °· You may briefly wet your wound in the shower or bath. Do not soak or scrub the wound. Do not swim. Avoid periods of heavy sweating until the skin adhesive has fallen off on its own. After showering or bathing, gently pat the wound dry with a clean towel.   °· Do not apply liquid medicine, cream medicine, ointment medicine, or makeup to your wound while the skin adhesive is in place. This may loosen the film before your wound is healed.   °· If a dressing is placed over the wound, be careful not to apply tape directly over the skin adhesive. This may cause the adhesive to be pulled off before the wound is healed.   °· Avoid prolonged exposure to sunlight or tanning lamps while the skin adhesive is in place. °· The skin adhesive will usually remain in place for 5-10 days, then naturally fall off the skin. Do not pick at the adhesive film.   °After Healing: °Once the wound has healed, cover the wound with sunscreen during the day for 1 full year. This can help minimize scarring. Exposure to ultraviolet light in the first year will darken the scar. It can take 1-2 years for the scar to lose its redness and to heal completely.  °SEEK MEDICAL CARE IF: °· You have a fever. °SEEK IMMEDIATE MEDICAL CARE IF: °· You have redness, pain, or  swelling around the wound.   °· You see a yellowish-white fluid (pus) coming from the wound.   °  °This information is not intended to replace advice given to you by your health care provider. Make sure you discuss any questions you have with your health care provider. °  °Document Released: 03/02/2004 Document Revised: 02/13/2014 Document Reviewed: 09/05/2012 °Elsevier Interactive Patient   Education ©2016 [employer] ° °

## 2015-04-17 NOTE — ED Notes (Signed)
Patient states he tripped over his cat at home at 0900 today and fell hitting his forehead. States "I didn't lose consciousness at first but a little bit later I passed out and when I woke up there were like six cops standing over me screaming at me because my brother has a heroin problem and had left his drug paraphernalia laying around and they thought I had been shooting up." States he took one hydrocodone prior to arrival to ER due to the pain in his head. Patient has laceration noted to middle of forehead at triage. Bleeding controlled at triage.

## 2015-06-16 ENCOUNTER — Encounter (HOSPITAL_COMMUNITY): Payer: Self-pay

## 2015-06-16 ENCOUNTER — Emergency Department (HOSPITAL_COMMUNITY)
Admission: EM | Admit: 2015-06-16 | Discharge: 2015-06-16 | Disposition: A | Discharge status: Home / Self Care | Payer: Self-pay | Attending: Emergency Medicine | Admitting: Emergency Medicine

## 2015-06-16 DIAGNOSIS — F17210 Nicotine dependence, cigarettes, uncomplicated: Secondary | ICD-10-CM | POA: Insufficient documentation

## 2015-06-16 DIAGNOSIS — K047 Periapical abscess without sinus: Secondary | ICD-10-CM | POA: Insufficient documentation

## 2015-06-16 MED ORDER — AMOXICILLIN 500 MG PO CAPS: 500.0000 mg | ORAL_CAPSULE | Freq: Three times a day (TID) | ORAL | Status: AC

## 2015-06-16 MED ORDER — TRAMADOL HCL 50 MG PO TABS: 50.0000 mg | ORAL_TABLET | Freq: Four times a day (QID) | ORAL | Status: AC | PRN

## 2015-06-16 NOTE — ED Notes (Signed)
Pt with left lower dental pain for ongoing for a half a week per pt.  Using orajel at home and essential oil ? Clove without relief.  Took an allergy med with tylenol last night

## 2015-06-16 NOTE — ED Notes (Signed)
Left lower dental pain X2 days.

## 2015-06-16 NOTE — Discharge Instructions (Signed)

## 2015-06-16 NOTE — ED Provider Notes (Signed)
CSN: [id number]     Arrival date & time 06/16/15  1322 History   First MD Initiated Contact with Patient 06/16/15 1339     Chief Complaint  Patient presents with  . Dental Pain     (Consider location/radiation/quality/duration/timing/severity/associated sxs/prior Treatment) The history is provided by the patient.   Mark Glover is a 30 y.o. male presenting with a 2 day history of dental pain and gingival swelling.   The patient has a history of injury and/or decay in the tooth involved which has recently started to cause increased  pain.  There has been no fevers, chills, nausea or vomiting, also no complaint of difficulty swallowing, although chewing makes pain worse.  The patient has tried clove oil, oragel and ibuprofen without relief of symptoms.         History reviewed. No pertinent past medical history. History reviewed. No pertinent past surgical history. History reviewed. No pertinent family history. Social History  Substance Use Topics  . Smoking status: Current Every Day Smoker -- 0.50 packs/day    Types: Cigarettes  . Smokeless tobacco: None  . Alcohol Use: Yes     Comment: occasionally    Review of Systems  Constitutional: Negative for fever.  HENT: Positive for dental problem. Negative for facial swelling and sore throat.   Respiratory: Negative for shortness of breath.   Musculoskeletal: Negative for neck pain and neck stiffness.      Allergies  Poison ivy extract  Home Medications   Prior to Admission medications   Medication Sig Start Date End Date Taking? Authorizing Provider  amoxicillin (AMOXIL) 500 MG capsule Take 1 capsule (500 mg total) by mouth 3 (three) times daily. 06/16/15 06/26/15  Burgess Amor, PA-C  traMADol (ULTRAM) 50 MG tablet Take 1 tablet (50 mg total) by mouth every 6 (six) hours as needed for moderate pain. 06/16/15   Burgess Amor, PA-C   BP 114/67 mmHg  Pulse 84  Temp(Src) 98.2 F (36.8 C) (Oral)  Resp 18  Ht  (1.727 m)   Wt 68.04 kg  BMI 22.81 kg/m2  SpO2 100% Physical Exam  Constitutional: He is oriented to person, place, and time. He appears well-developed and well-nourished. No distress.  HENT:  Head: Normocephalic and atraumatic.  Right Ear: Tympanic membrane and external ear normal.  Left Ear: Tympanic membrane and external ear normal.  Mouth/Throat: Uvula is midline, oropharynx is clear and moist and mucous membranes are normal. No oral lesions. No trismus in the jaw. No dental abscesses.    Generalized poor dentition.    Eyes: Conjunctivae are normal.  Neck: Normal range of motion. Neck supple.  Cardiovascular: Normal rate and normal heart sounds.   Pulmonary/Chest: Effort normal.  Musculoskeletal: Normal range of motion.  Lymphadenopathy:    He has no cervical adenopathy.  Neurological: He is alert and oriented to person, place, and time.  Skin: Skin is warm and dry. No erythema.  Psychiatric: He has a normal mood and affect.    ED Course  Procedures (including critical care time) Labs Review Labs Reviewed - No data to display  Imaging Review No results found. I have personally reviewed and evaluated these images and lab results as part of my medical decision-making.   EKG Interpretation None      MDM   Final diagnoses:  Dental infection    Amoxil, tramadol, continue ibu.  Dental referral given.  The patient appears reasonably screened and/or stabilized for discharge and I doubt any other medical condition  or other [facility] requiring further screening, evaluation, or treatment in the ED at this time prior to discharge.     Burgess AmorJulie Precious Gilchrest, PA-C 06/16/15 1403  Blane OharaJoshua Zavitz, MD 06/16/15 [phone]

## 2016-08-27 ENCOUNTER — Emergency Department (HOSPITAL_BASED_OUTPATIENT_CLINIC_OR_DEPARTMENT_OTHER)
Admission: EM | Admit: 2016-08-27 | Discharge: 2016-08-27 | Disposition: A | Discharge status: Home / Self Care | Payer: Self-pay | Attending: Emergency Medicine | Admitting: Emergency Medicine

## 2016-08-27 ENCOUNTER — Encounter (HOSPITAL_BASED_OUTPATIENT_CLINIC_OR_DEPARTMENT_OTHER): Payer: Self-pay

## 2016-08-27 DIAGNOSIS — R1110 Vomiting, unspecified: Secondary | ICD-10-CM | POA: Insufficient documentation

## 2016-08-27 DIAGNOSIS — F17210 Nicotine dependence, cigarettes, uncomplicated: Secondary | ICD-10-CM | POA: Insufficient documentation

## 2016-08-27 DIAGNOSIS — R197 Diarrhea, unspecified: Secondary | ICD-10-CM | POA: Insufficient documentation

## 2016-08-27 DIAGNOSIS — Z0000 Encounter for general adult medical examination without abnormal findings: Secondary | ICD-10-CM

## 2016-08-27 DIAGNOSIS — R109 Unspecified abdominal pain: Secondary | ICD-10-CM | POA: Insufficient documentation

## 2016-08-27 NOTE — ED Triage Notes (Signed)
Yesterday abd pain and vomited - pt feels ok today - just needs a paper that states he is ok to go back to work in [employer]

## 2016-08-27 NOTE — ED Provider Notes (Signed)
Guadalupe Dawnintia Hollynn Garno PA-C   [facility]  [address]  [place], [place] [id number]    Emergency Department Visit Note      Date: 08/27/2016  Primary care provider: No Pcp  Means of arrival: private car  History obtained by: patient  History limited by: none    Chief Complaint:  Other      History of Present Illness     Bradley Ware, date of birth 09/08/1985, is a 31 y.o. male who presents to the Emergency Department for work note.  Patient stated that he had food poisoning yesterday after he ate some leftover nachos .  Patient stated that he had 1 episode of diarrhea and 1 episode of vomiting last night but woke up with no symptoms.  Patient went to work today and his boss told him that he needed to come to see a doctor in bring a work note to make sure that he would be okay to return to work.  Patient stated that he ate 2 X today and had no symptoms.     Denies fever, chills, sore throat, ear drainage or pain, cough, sob, wheezing, hemoptysis, chest pain, nausea, vomiting, abdominal pain, diarrhea, urinary symptoms, back or flank pain, calf pain, peripheral edema or rash.          Review of Systems     The pertinent positive and negative symptoms are as per HPI. All other systems reviewed and are negative.    Patient History      Past Medical History:  History reviewed. No pertinent past medical history.    Past Surgical History:  History reviewed. No pertinent surgical history.    Family History:  Family Medical History     None              Social History:  Social History   Substance Use Topics   . Smoking status: Current Every Day Smoker     Packs/day: 0.50     Types: Cigarettes   . Smokeless tobacco: Never Used   . Alcohol use Yes      Comment: socially     History   Drug Use No       Medications:  Patient's Medications   New Prescriptions    No medications on file   Previous Medications    IBUPROFEN (MOTRIN) 800 MG ORAL TABLET    Take 1 Tab (800 mg total) by mouth Three times a day as needed for Pain    Modified Medications    No medications on file   Discontinued Medications    HYDROCODONE-ACETAMINOPHEN (LORTAB/VICODIN) 5-500 MG ORAL TABLET    Take 1-2 Tabs by mouth Every 4 hours as needed       Allergies:   No Known Allergies    Physical Exam     Vital Signs:    Filed Vitals:    08/27/16 2249   BP: (!) 162/77   Pulse: 89   Resp: 18   Temp: 36.8 C (98.2 F)   SpO2: 100%       Pulse Ox: 100% on None (Room Air); interpreted by me JX:BJYNWGas:Normal    Constitutional: This is an awake and WDWN male patient who is showing no outward signs of distress.    HEENT:   Head: Normocephalic and atraumatic.   Mouth/Throat: Oropharynx is clear and moist.No erythema or exudate, no uvula deviation.   Ears: EACs show no erythema or exudates and TMs are intact bilaterally with no exudates  Eyes: EOMI, PERRL, conjunctiva normal in appearance, no gross deformity noted,   Neck: Trachea midline. Neck supple. No adenopathy  Cardiovascular: RRR, no obvious murmurs, rubs or gallops appreciated. No carotid bruits  Intact distal pulses. No JVD   Pulmonary/Chest: No respiratory distress. BS clear and equal to auscultation bilaterally. No rales , wheezes or rhonchi.  Abdominal: BS +. Abdomen soft, no tenderness, rebound or guarding. No obvious masses or organomegaly.  No CVAT unremarkable abdominal exam  Musculoskeletal/ Peripheral Vascular:  No obvious edema or deformity.  Moves spine and extremities freely. No calf tenderness. FROM in all joints of upper and lower extremities with passive and active ROM.Marland Kitchen capillary refill less than 2 sec, Pulses are 2+ and symmetric.    Diagnostics     Labs:  No results found for any visits on 08/27/16.        ED Progress Note/Medical Decision Making     Old records reviewed by me: none related to the chief complain   ED Visits     Complaint Diagnosis Description Type Department Provider     06/07/11 Facial Swelling; Toothache Dental abscess ED (Discharged) J.ER           I have reviewed the patient's recent past  medical history. Nurse's notes reviewed.  Orders placed during this encounter:  No orders of the defined types were placed in this encounter.    10:55 PM  Pt was initially evaluated by me at this time, patient has no symptoms of vomiting, nausea, diarrhea, or abdominal pain.   The possible etiologies for symptoms were discussed with pt and the no need for further evaluation , pt verbalized understanding and was in agreement with the plan at this time  The patient was advised that if symptoms are to worsen that he is to return to the ED immediately, he verbalized understanding. Return precautions were discussed, pt verbalized understanding and is currently stable for discharge. All questions and concerns were answered to his satisfaction and pt is in agreement with the plan.    I have counseled the patient for at least three minutes on tobacco cessation by discussion, providing resources and follow up.   Patient is to follow up with primary care provider as needed.  Patient was discharged, patient is stable and may return to work tomorrow.    Supervision physician: Dr. Ronita Hipps Vitals:    08/27/16 2249   BP: (!) 162/77   Pulse: 89   Resp: 18   Temp: 36.8 C (98.2 F)   SpO2: 100%         Clinical Impression      Encounter Diagnosis   Name Primary?   . Normal physical examination Yes      Smoking cessation advisement   Tobacco abuse       Plan/Disposition     Discharged    Prescriptions:  New Prescriptions    No medications on file       Follow Up:  Sherrilyn Rist, MD  [address]  [address]  [place] [address]  [phone]      As needed    [facility]  [address]  [place] [address]  [phone]    As needed         Condition on Disposition: Stable

## 2016-08-27 NOTE — ED Nurses Note (Signed)
Patient discharged home with family.  AVS reviewed with patient/care giver.  A written copy of the AVS and discharge instructions was given to the patient/care giver.  Questions sufficiently answered as needed.  Patient/care giver encouraged to follow up with PCP as indicated.  In the event of an emergency, patient/care giver instructed to call 911 or go to the nearest emergency room.        Current Discharge Medication List      CONTINUE these medications - NO CHANGES were made during your visit.       Details    Ibuprofen 800 mg Tablet   Commonly known as:  MOTRIN    800 mg, Oral, 3x/day PRN   Qty:  30 Tab   Refills:  0

## 2016-11-16 IMAGING — CT CT HEAD W/O CM
1 series · 16 of 30 positions shown, 20 images · non-contrast
Comparison: None.

CLINICAL DATA: Tripped over CT and fell striking forehead on ground

EXAM:
CT HEAD WITHOUT CONTRAST
TECHNIQUE: Contiguous axial images were obtained from the base of the skull
through the vertex without intravenous contrast.

[Series 2: headtrauma 4.8 h37s · axial · 0.43mm/px · z∈[+64,+221]mm · 16 of 36 slices shown, 20 images]
[im 2/36  brain]
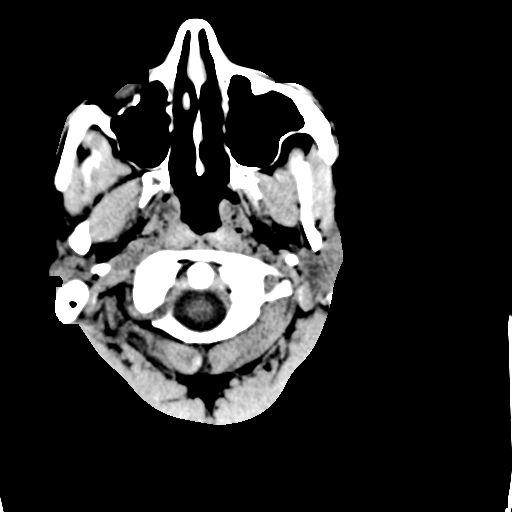
[im 2/36  bone]
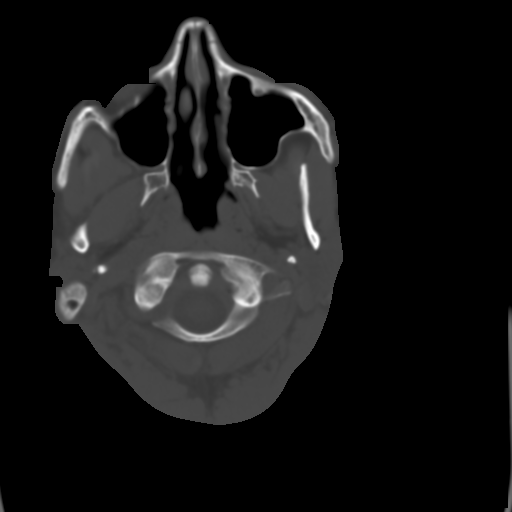
[im 4/36  brain]
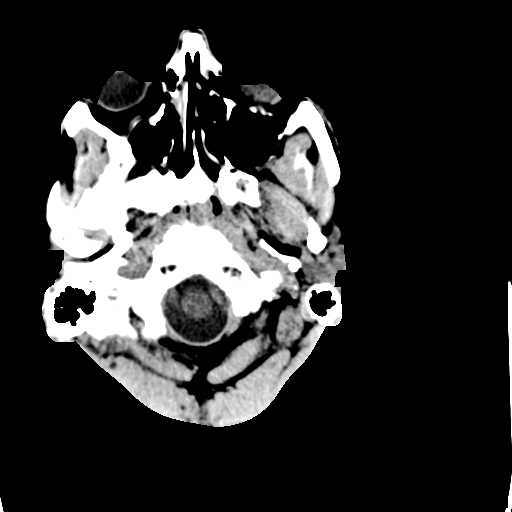
[im 7/36  brain]
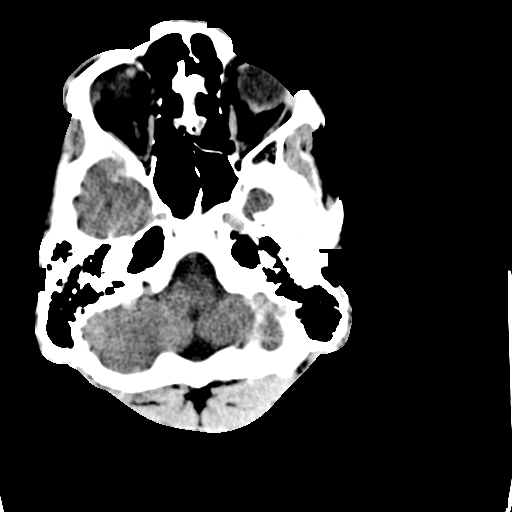
[im 9/36  brain]
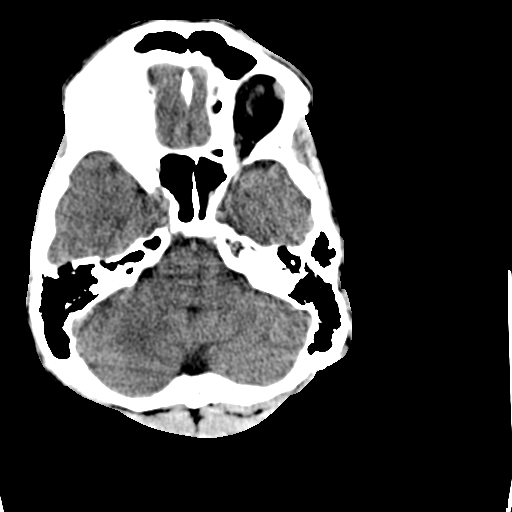
[im 10/36  brain]
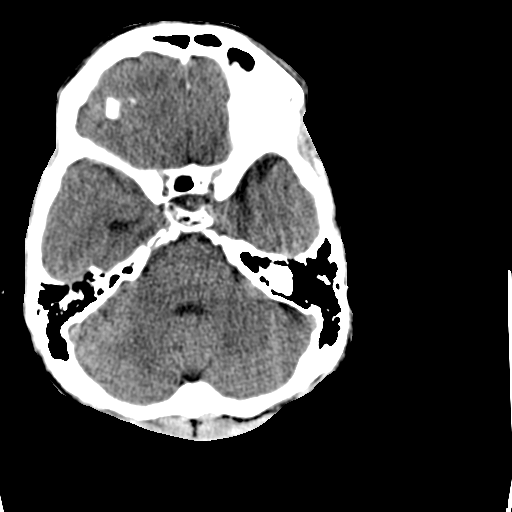
[im 10/36  bone]
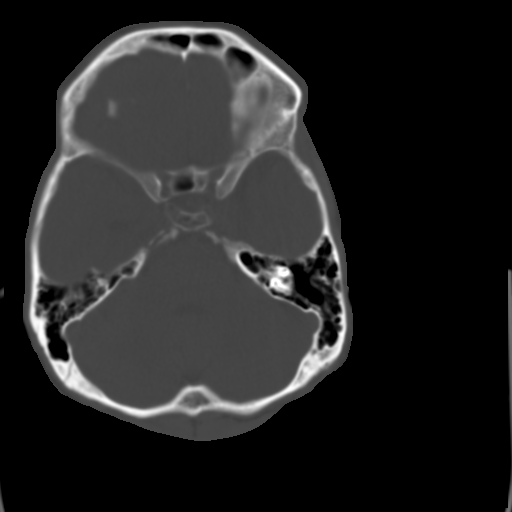
[im 13/36  brain]
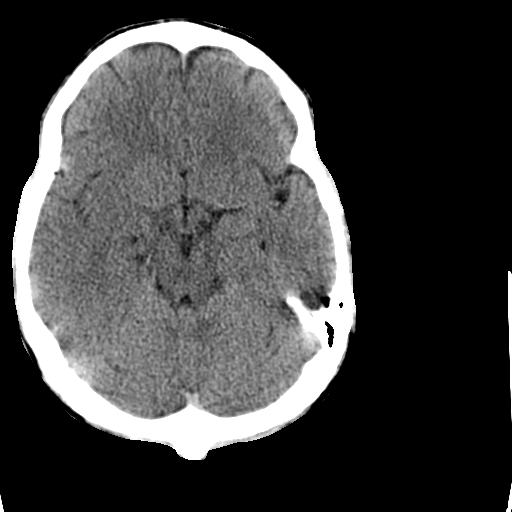
[im 15/36  brain]
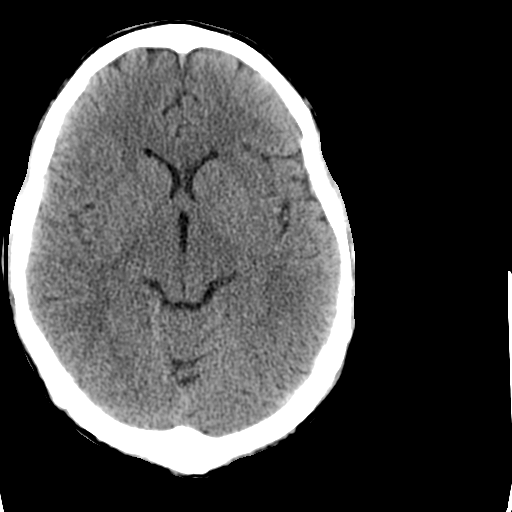
[im 17/36  brain]
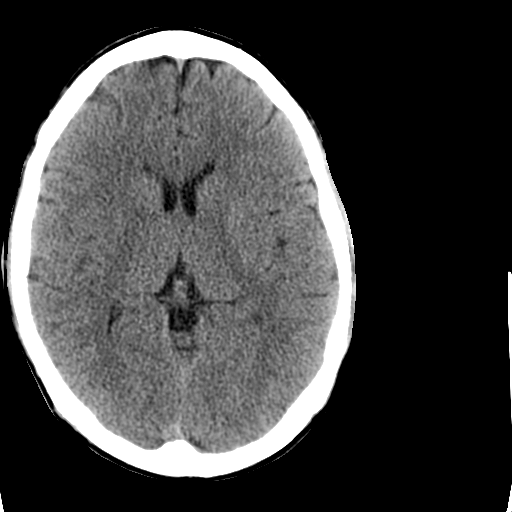
[im 19/36  brain]
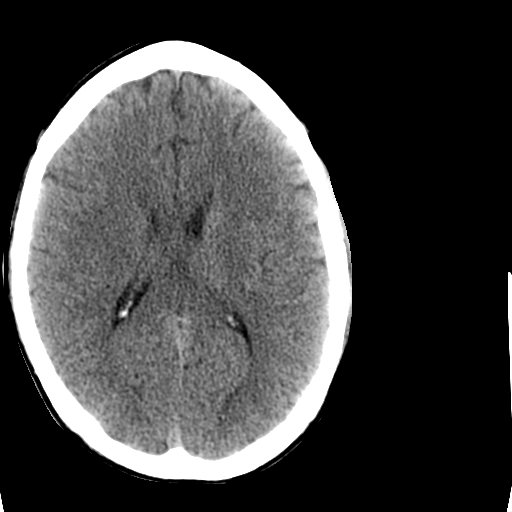
[im 19/36  bone]
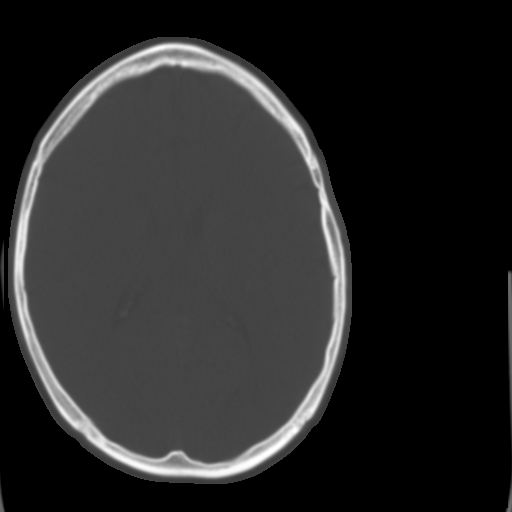
[im 21/36  brain]
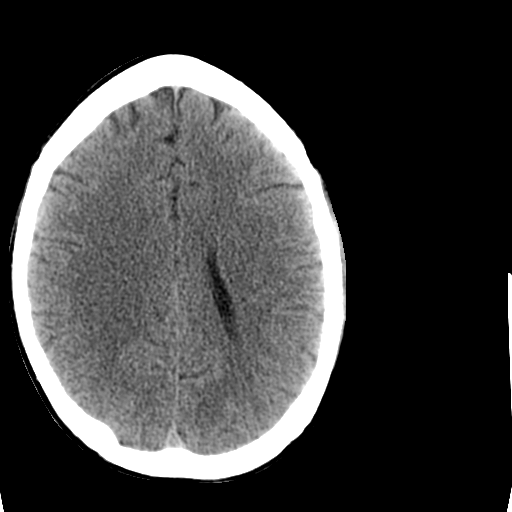
[im 23/36  brain]
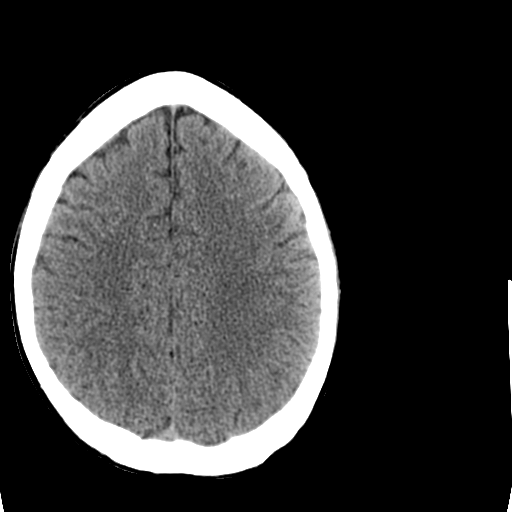
[im 26/36  brain]
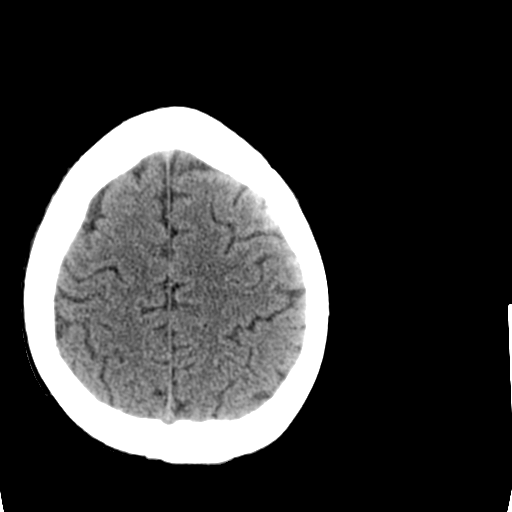
[im 27/36  brain]
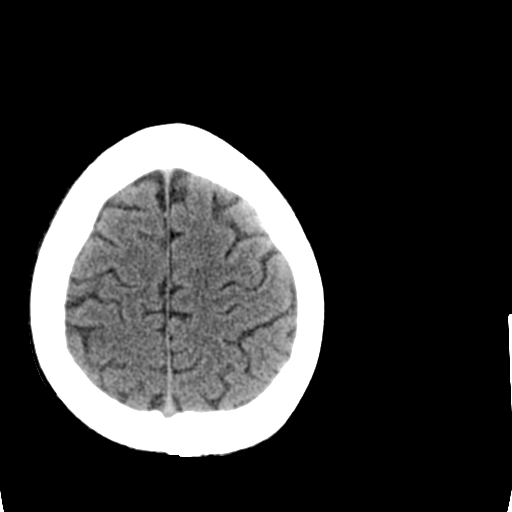
[im 27/36  bone]
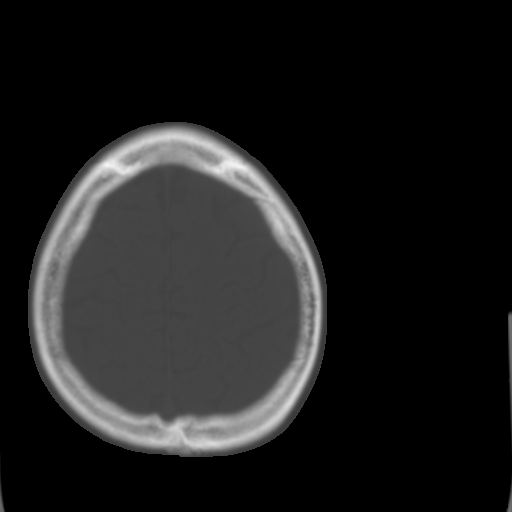
[im 29/36  brain]
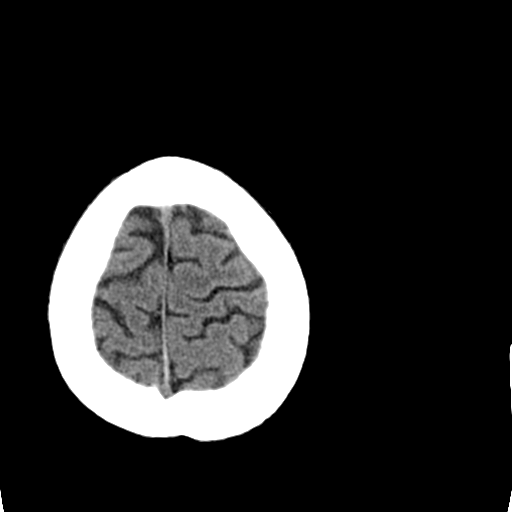
[im 32/36  brain]
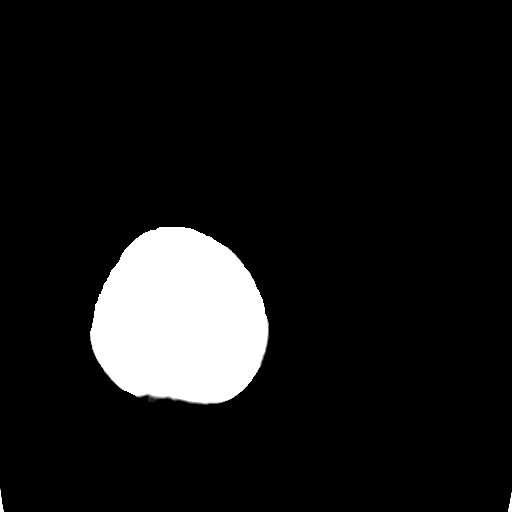
[im 34/36  brain]
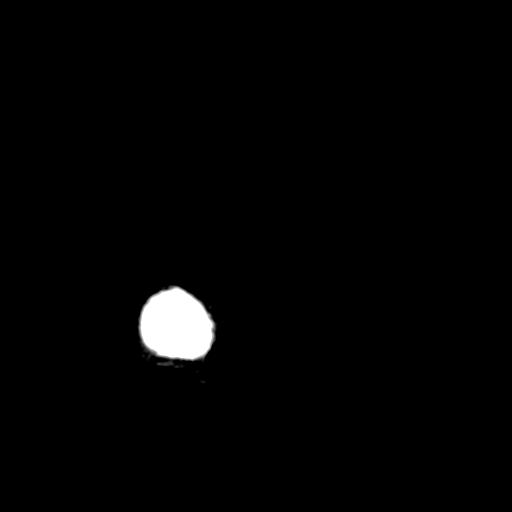

[16 of 30 positions shown; findings below may reference images not displayed]

FINDINGS: There is no evidence for acute hemorrhage, hydrocephalus, mass
lesion, or abnormal extra-axial fluid collection. No definite CT
evidence for acute infarction. The visualized paranasal sinuses and
mastoid air cells are clear. No evidence for skull fracture.
IMPRESSION: Normal exam.

## 2017-08-03 ENCOUNTER — Emergency Department (EMERGENCY_DEPARTMENT_HOSPITAL): Payer: Self-pay

## 2017-08-03 ENCOUNTER — Emergency Department
Admission: EM | Admit: 2017-08-03 | Discharge: 2017-08-03 | Disposition: A | Discharge status: Home / Self Care | Payer: Self-pay | Attending: Physician Assistant | Admitting: Physician Assistant

## 2017-08-03 ENCOUNTER — Other Ambulatory Visit: Payer: Self-pay

## 2017-08-03 DIAGNOSIS — M7989 Other specified soft tissue disorders: Secondary | ICD-10-CM

## 2017-08-03 DIAGNOSIS — S51812A Laceration without foreign body of left forearm, initial encounter: Secondary | ICD-10-CM | POA: Insufficient documentation

## 2017-08-03 DIAGNOSIS — F17210 Nicotine dependence, cigarettes, uncomplicated: Secondary | ICD-10-CM | POA: Insufficient documentation

## 2017-08-03 DIAGNOSIS — W25XXXA Contact with sharp glass, initial encounter: Secondary | ICD-10-CM | POA: Insufficient documentation

## 2017-08-03 DIAGNOSIS — Z23 Encounter for immunization: Secondary | ICD-10-CM | POA: Insufficient documentation

## 2017-08-03 MED ORDER — KETOROLAC 15 MG/ML INJECTION SOLUTION
15.00 mg | INTRAMUSCULAR | Status: AC
Start: 2017-08-03 — End: 2017-08-03
  Administered 2017-08-03: 15 mg via INTRAMUSCULAR
  Filled 2017-08-03: qty 1

## 2017-08-03 MED ORDER — DIPHTH,PERTUSSIS(ACEL),TETANUS 2.5 LF UNIT-8 MCG-5 LF/0.5ML IM SYRINGE
0.50 mL | INJECTION | INTRAMUSCULAR | Status: AC
Start: 2017-08-03 — End: 2017-08-03
  Administered 2017-08-03: 0.5 mL via INTRAMUSCULAR
  Filled 2017-08-03: qty 0.5

## 2017-08-03 MED ORDER — LIDOCAINE 1 %-EPINEPHRINE 1:100,000 INJECTION SOLUTION
15.00 mL | INTRAMUSCULAR | Status: AC
Start: 2017-08-03 — End: 2017-08-03
  Administered 2017-08-03: 15 mL via INTRADERMAL
  Filled 2017-08-03: qty 20

## 2017-08-03 MED ORDER — CEPHALEXIN 500 MG CAPSULE
500.00 mg | ORAL_CAPSULE | Freq: Four times a day (QID) | ORAL | 0 refills | Status: AC
Start: 2017-08-03 — End: ?

## 2017-08-03 NOTE — ED Triage Notes (Signed)
Start of triage delayed due to getting bleeding under control. Patient arrives POV for laceration to the back of left arm. Paitetn fell into a window and has a laceration of about 2 inches in length. Tetanus is not up to date. 4x4 guaze placed over wound and ace bandage wrapped for pressure dressing.

## 2017-08-03 NOTE — ED Provider Notes (Signed)
[facility]  Emergency Department     HISTORY OF PRESENT ILLNESS     Date:  08/03/2017  Patient's Name:  Bradley Ware  Date of Birth:  August 04, 1985    Patient comes emergency room for evaluation of laceration to his left forearm.  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  Patient notes there was a piece of glass sticking out of the wound in his arm which fell out.  Patient denies any numbness or weakness.  Patient's last tetanus vaccination approximately 12 years ago.        Review of Systems     Review of Systems   Constitutional: Negative for chills and fever.   HENT: Negative for facial swelling.    Respiratory: Negative for shortness of breath.    Cardiovascular: Negative for chest pain.   Gastrointestinal: Negative for abdominal pain.   Musculoskeletal: Negative for arthralgias.   Skin: Positive for wound.   Allergic/Immunologic: Negative for immunocompromised state.   Neurological: Negative for dizziness.   Hematological: Does not bruise/bleed easily.       Previous History     Past Medical History:  History reviewed. No pertinent past medical history.    Past Surgical History:  Past Surgical History:   Procedure Laterality Date   . Hx wisdom teeth extraction         Social History:  Social History     Tobacco Use   . Smoking status: Current Every Day Smoker     Packs/day: 0.50     Types: Cigarettes   . Smokeless tobacco: Never Used   Substance Use Topics   . Alcohol use: Yes     Comment: socially   . Drug use: No     Social History     Substance and Sexual Activity   Drug Use No       Family History:  No family history on file.    Medication History:  Current Outpatient Medications   Medication Sig   . cephalexin (KEFLEX) 500 mg Oral Capsule Take 1 Cap (500 mg total) by mouth Four times a day       Allergies:  No Known Allergies    Physical  Exam     Vitals:    BP (!) 132/91   Pulse 81   Temp 36.8 C (98.3 F)   Resp 16   Wt 81.8 kg (180 lb 5.4 oz)   SpO2 100%   BMI 28.24 kg/m       Physical Exam   Constitutional: He is oriented to person, place, and time. He appears well-developed and well-nourished. No distress.   HENT:   Head: Normocephalic and atraumatic.   Eyes: Conjunctivae are normal.   Neck: Normal range of motion.   Cardiovascular: Normal rate and regular rhythm.   Pulmonary/Chest: Effort normal and breath sounds normal.   Abdominal: Soft.   Musculoskeletal: Normal range of motion.   Neurological: He is alert and oriented to person, place, and time.   Skin: Skin is warm and dry. Laceration noted.        4 cm laceration to inferior aspect of patient's left proximal forearm.       Diagnostic Studies/Treatment     Medications:  Medications   ketorolac (TORADOL) 15 mg/mL injection (15 mg Intramuscular Given 08/03/17 1333)   lidocaine  1%-EPINEPHrine 1:100,000 injection (15 mL Intradermal Given 08/03/17 1347)   diphtheria, pertussis-acell, tetanus (BOOSTRIX) IM injection (0.5 mL Intramuscular Given 08/03/17 1333)       New Prescriptions    CEPHALEXIN (KEFLEX) 500 MG ORAL CAPSULE    Take 1 Cap (500 mg total) by mouth Four times a day       Labs:    No results found for any visits on 08/03/17.    Radiology:  XR FOREARM LEFT    XR FOREARM LEFT   Final Result   No evidence of acute fracture or radiopaque foreign body..            Radiologist location ID: [id number]             ECG:  NONE      Procedure     LAC REPAIR/WOUND CLOSURE  Date/Time: 08/03/2017 2:38 PM  Performed by: Simone CuriaGrant, Pape Parson, PA-C  Authorized by: Simone CuriaGrant, Jt Brabec, PA-C     Consent:     Consent given by:  Patient    Risks discussed:  Infection  Anesthesia (see MAR for exact dosages):     Anesthesia method:  Local infiltration    Local anesthetic:  Lidocaine 1% WITH epi  Laceration details:     Location:  Shoulder/arm    Shoulder/arm location:  L lower arm    Length (cm):  4  Repair type:      Repair type:  Simple  Pre-procedure details:     Preparation:  Patient was prepped and draped in usual sterile fashion  Exploration:     Hemostasis achieved with:  Epinephrine    Wound exploration: wound explored through full range of motion and entire depth of wound probed and visualized    Treatment:     Area cleansed with:  Saline and Betadine    Irrigation solution:  Sterile saline    Irrigation method:  Pressure wash  Skin repair:     Repair method:  Sutures    Suture size:  4-0    Suture material:  Nylon    Suture technique:  Simple interrupted    Number of sutures:  9  Post-procedure details:     Dressing:  Antibiotic ointment and non-adherent dressing    Patient tolerance of procedure:  Tolerated well, no immediate complications        Course/Disposition/Plan     Course:    Patient's wound was anesthetized with lidocaine and epinephrine.  Wound irrigated with saline and Betadine.  Wound closure done by physician assistant student under my direct supervision.    Wound was dressed with bacitracin nonstick dressing and an Ace wrap to help prevent hematoma formation.  Will treat patient with Keflex for 5 days for wound prophylaxis.  Patient was sutures removed in 14 days.        Disposition:    Discharged    Condition at Disposition:   Stable      Follow up:   Jewel BaizeBagree, Sarika, MD  [address]  [place] [id number]  [phone]            Clinical Impression:     Encounter Diagnosis   Name Primary?   . Forearm laceration, left, initial encounter Yes       Future Appointments Scheduled in Epic:  No future appointments.

## 2017-08-03 NOTE — ED Nurses Note (Signed)
PA student in for suture prep. Supplies at bedside.

## 2017-08-03 NOTE — ED Nurses Note (Signed)
Wound covered with Telfa and ace bandage.

## 2017-08-03 NOTE — ED Nurses Note (Signed)
Patient discharged home with family.  AVS reviewed with patient/care giver.  A written copy of the AVS and discharge instructions was given to the patient/care giver.  Questions sufficiently answered as needed.  Patient/care giver encouraged to follow up with PCP as indicated.  In the event of an emergency, patient/care giver instructed to call 911 or go to the nearest emergency room.     Pt verbalizes understanding of wound/suture care followup as well as instructions for abx.  Pt in no distress at time of dc.

## 2019-06-09 ENCOUNTER — Emergency Department
Admission: EM | Admit: 2019-06-09 | Discharge: 2019-06-09 | Disposition: A | Discharge status: Home / Self Care | Payer: Self-pay | Attending: Emergency Medicine | Admitting: Emergency Medicine

## 2019-06-09 ENCOUNTER — Emergency Department (HOSPITAL_COMMUNITY): Payer: Self-pay

## 2019-06-09 ENCOUNTER — Other Ambulatory Visit: Payer: Self-pay

## 2019-06-09 DIAGNOSIS — F17210 Nicotine dependence, cigarettes, uncomplicated: Secondary | ICD-10-CM | POA: Insufficient documentation

## 2019-06-09 DIAGNOSIS — S62009A Unspecified fracture of navicular [scaphoid] bone of unspecified wrist, initial encounter for closed fracture: Secondary | ICD-10-CM

## 2019-06-09 DIAGNOSIS — S62001A Unspecified fracture of navicular [scaphoid] bone of right wrist, initial encounter for closed fracture: Secondary | ICD-10-CM | POA: Insufficient documentation

## 2019-06-09 DIAGNOSIS — W1830XA Fall on same level, unspecified, initial encounter: Secondary | ICD-10-CM | POA: Insufficient documentation

## 2019-06-09 NOTE — ED Provider Notes (Signed)
Dr. Si Gaul. [facility], [facility]          Emergency Department Visit Note    Date of Service: 06/09/2019  Primary Care Doctor:No Pcp  Patient information was obtained from patient.  History/Exam limitations: none.  Patient presented to the Emergency Department by private vehicle.    Chief Complaint:  Wrist pain    HPI: The patient is a(an) 34 y.o. male.     Two days ago patient fell onto outstretched right arm injuring his wrist.  Radiates into the proximal hand and distal forearm.  Worse with movement.  Better with Motrin.  Symptoms are constant.      Review of Systems:    The pertinent positive and negative symptoms are as per HPI. All other systems reviewed and are negative.      Past Medical History:  No past medical history on file.      Past Surgical History:  Past Surgical History:   Procedure Laterality Date    HX WISDOM TEETH EXTRACTION             Family History:  Family Medical History:     None              No acute family history provided  Social History:  Social History     Tobacco Use    Smoking status: Current Every Day Smoker     Packs/day: 0.50     Types: Cigarettes    Smokeless tobacco: Never Used   Substance Use Topics    Alcohol use: Yes     Comment: socially    Drug use: No     Social History     Substance and Sexual Activity   Drug Use No       Current Outpatient Medications:   No outpatient medications have been marked as taking for the 06/09/19 encounter [facility] Encounter).       Allergies:   No Known Allergies    Physical Exam     Vital Signs:  Filed Vitals:    06/09/19 1227 06/09/19 1237   BP: (!) 143/77    Pulse: 79 83   Resp: 18 18   Temp: 36.6 C (97.8 F) 37 C (98.6 F)   SpO2: 100% 100%       The initial visit vital signs are reviewed as above.    Pulse oximetry is 100% on ra. normal    Physical Exam:   General: No apparent acute distress. Very pleasant.   Eyes: Conjunctiva are clear. Pupils are equal, round  HENT: Mucous membranes are moist.  Nares are clear.    Extremities:  Tender palpation right wrist, neurovascularly intact distally  Skin: Warm and dry without rashes  Neurologic: Strength and sensation grossly normal throughout.  Psychiatric: Alert and oriented. Affect within normal limits.      Diagnostics         Radiology:  XR WRIST RIGHT  CT HAND RIGHT WO IV CONTRAST,   Results for orders placed or performed during the hospital encounter of 06/09/19 (from the past 72 hour(s))   XR WRIST RIGHT     Status: None    Narrative    RADIOLOGIST: Randall Hiss, MD    EXAMINATION: XR WRIST RIGHT     EXAM DATE/TIME: 06/09/2019 1:51 PM    NUMBER OF VIEWS: 5 .    CLINICAL INDICATION: fell on wrist 2 d ago    COMPARISON: None.  FINDINGS: Questionable nondisplaced fracture of the scaphoid bone.  Follow-up CT recommended.      Impression    As above.      Radiologist location ID: [id number]     CT HAND RIGHT WO IV CONTRAST     Status: None    Narrative    RADIOLOGIST: Gennie Alma, MD    EXAMINATION: CT HAND RIGHT WO IV CONTRAST     EXAM DATE/TIME: 06/09/2019 2:26 PM    RADIATION DOSE: 196.60 mGy.cm    CLINICAL INDICATION: possible scaphoid bone fracture    TECHNIQUE: Axial plane images were performed. Coronal and Sagittal  reformatted image were obtained.    FINDINGS: There is a 1 x 2 mm bone fragment involving the distal third of  the scaphoid toward the ulnar side consistent with a tiny avulsion  fracture. No other hand or wrist fractures. No gross soft tissue hematoma.      Impression    Tiny avulsion fracture of the scaphoid, age uncertain. No other  abnormality.    M[employer]:  1. Dose reduction technique used. Automatic exposure control (AEC) was  applied.  2. Count of high-dose radiation studies (CT and cardiac and NM) last 12  months: 1.         Radiologist location ID: [id number]       , interpreted by radiologist and independently reviewed by me.    ED Course              Summary/ Plan:   Orders Placed This Encounter    XR WRIST RIGHT    CT HAND  RIGHT WO IV CONTRAST       Placed in thumb spica by ed tech checked by me remains nv intact distally      Encounter Diagnosis   Name Primary?    Scaphoid fracture of wrist Yes       Disposistion  1) Discharge home : Stable  2) Return to ED with worsening conditions or any new concerns  3) Strict return precautions given to patient  4) Patient understood and agrees with the plan    Filed Vitals:    06/09/19 1227 06/09/19 1237   BP: (!) 143/77    Pulse: 79 83   Resp: 18 18   Temp: 36.6 C (97.8 F) 37 C (98.6 F)   SpO2: 100% 100%         Prescriptions:  Current Discharge Medication List          Follow up:  [place]  [address]  [place] [address]  [phone]    Call in 1 day        Valarie Cones, MD 06/09/2019, 14:47         Portions of this note may be dictated using voice recognition software or a dictation service. Variances in spelling and vocabulary are possible and unintentional. Not all errors are caught/corrected. Please notify the [facility] if any discrepancies are noted or if the meaning of any statement is not clear.

## 2019-06-09 NOTE — ED Triage Notes (Signed)
R wrist pain due to falling on Saturday. Increase in pain and swelling.

## 2019-06-09 NOTE — ED Nurses Note (Signed)
Patient discharged home with family.  AVS reviewed with patient/care giver.  A written copy of the AVS and discharge instructions was given to the patient/care giver.  Questions sufficiently answered as needed.  Patient/care giver encouraged to follow up with PCP as indicated.  In the event of an emergency, patient/care giver instructed to call 911 or go to the nearest emergency room.

## 2024-04-02 ENCOUNTER — Ambulatory Visit (INDEPENDENT_AMBULATORY_CARE_PROVIDER_SITE_OTHER): Payer: Self-pay | Admitting: Student in an Organized Health Care Education/Training Program
